# Patient Record
Sex: Female | Born: 1970 | Race: White | Hispanic: No | Marital: Married | Smoking: Former smoker
Health system: Southern US, Community
[De-identification: ages and names within clinical notes are randomized; demographics above are authoritative.]

## PROBLEM LIST (undated history)

## (undated) ENCOUNTER — Emergency Department: Payer: Self-pay

## (undated) DIAGNOSIS — I10 Essential (primary) hypertension: Secondary | ICD-10-CM

## (undated) HISTORY — PX: CHOLECYSTECTOMY: SHX55

---

## 2006-11-18 ENCOUNTER — Encounter: Admission: RE | Admit: 2006-11-18 | Discharge: 2006-11-18 | Payer: Self-pay | Admitting: Gastroenterology

## 2006-12-21 ENCOUNTER — Ambulatory Visit (HOSPITAL_COMMUNITY): Admission: RE | Admit: 2006-12-21 | Discharge: 2006-12-21 | Payer: Self-pay | Admitting: General Surgery

## 2006-12-21 ENCOUNTER — Encounter (INDEPENDENT_AMBULATORY_CARE_PROVIDER_SITE_OTHER): Payer: Self-pay | Admitting: General Surgery

## 2008-08-16 ENCOUNTER — Ambulatory Visit: Payer: Self-pay | Admitting: Family Medicine

## 2008-08-16 DIAGNOSIS — J029 Acute pharyngitis, unspecified: Secondary | ICD-10-CM | POA: Insufficient documentation

## 2008-08-17 ENCOUNTER — Encounter: Payer: Self-pay | Admitting: Family Medicine

## 2009-01-28 IMAGING — CT CT ABDOMEN W/ CM
2 of 5 series · 17 of 46 positions shown, 19 images · IV contrast (READICAT/WATER & [ID] OMNI 300)
Comparison: None.

ABDOMEN CT WITH CONTRAST:

CLINICAL DATA: Abdominal pain in the right midabdomen
TECHNIQUE: Multidetector CT imaging of the abdomen and pelvis was performed
following the standard protocol during bolus administration of intravenous
contrast.

Contrast:  100 cc Omnipaque 300

[Series 3: routine abdomen · axial · 0.73mm/px · z∈[-388,-18]mm · 14 of 75 slices shown, 16 images]
[im 5/75  soft-tissue]
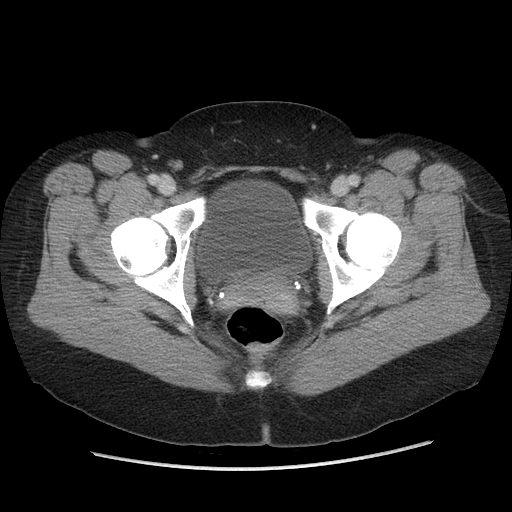
[im 5/75  bone]
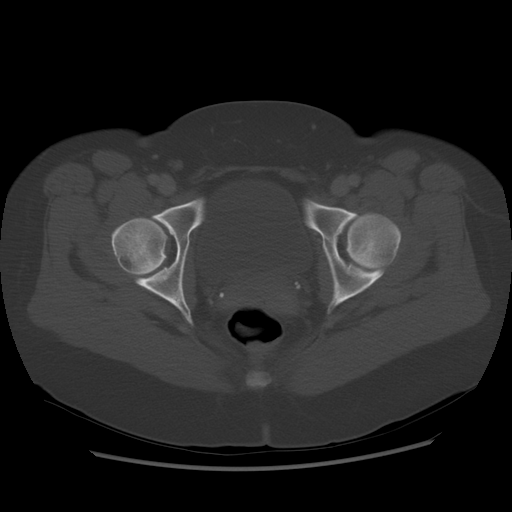
[im 9/75  soft-tissue]
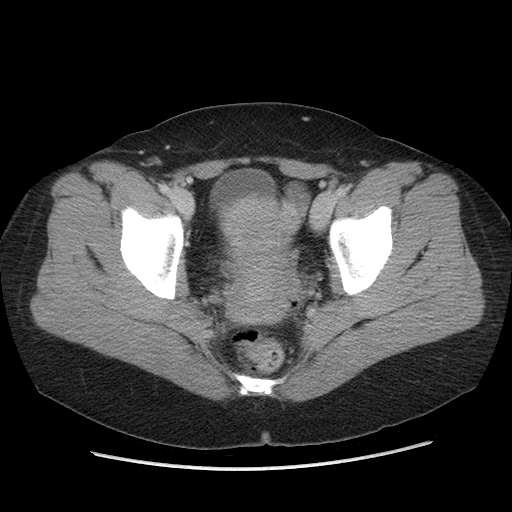
[im 14/75  soft-tissue]
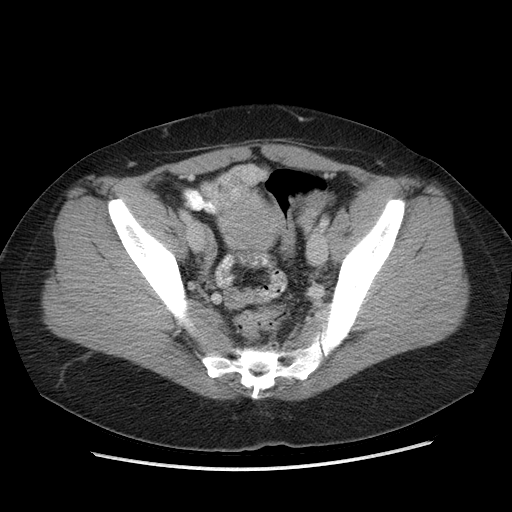
[im 22/75  soft-tissue]
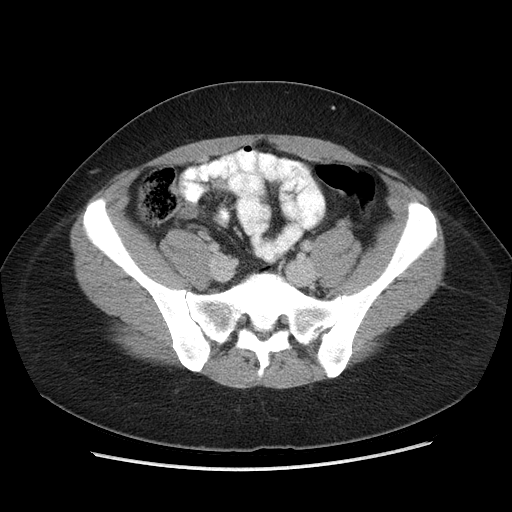
[im 27/75  soft-tissue]
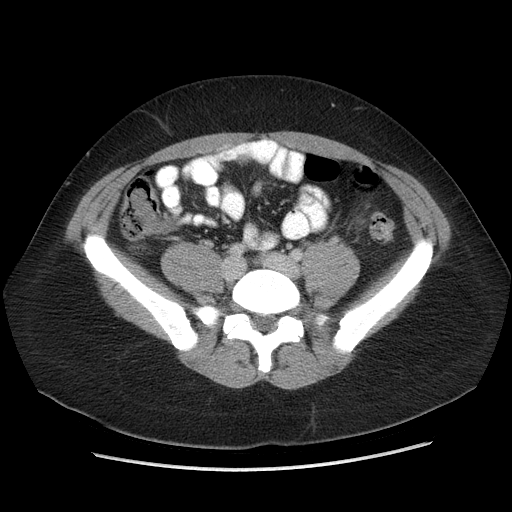
[im 31/75  soft-tissue]
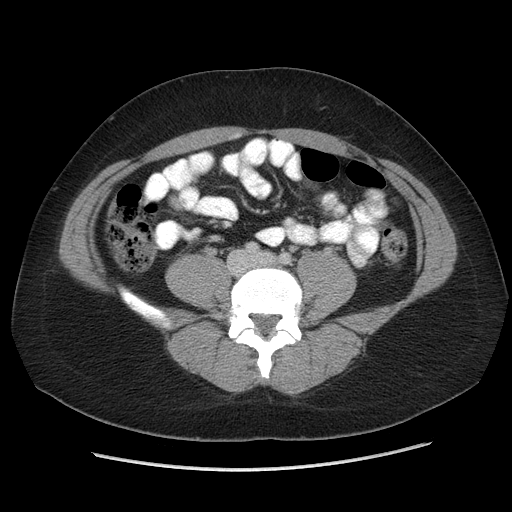
[im 35/75  soft-tissue]
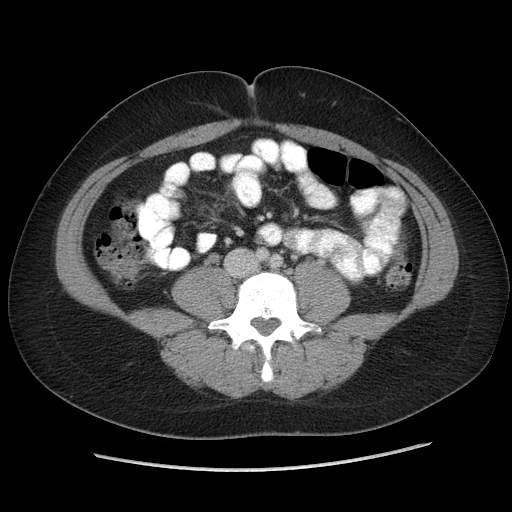
[im 40/75  soft-tissue]
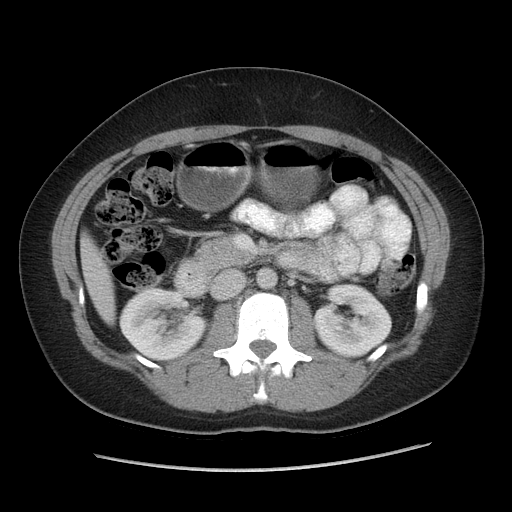
[im 44/75  soft-tissue]
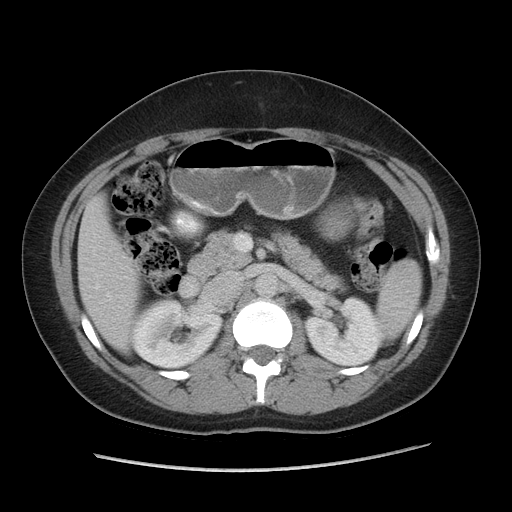
[im 44/75  bone]
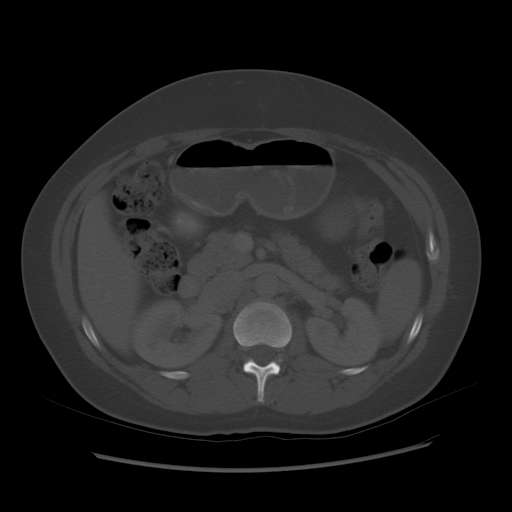
[im 48/75  soft-tissue]
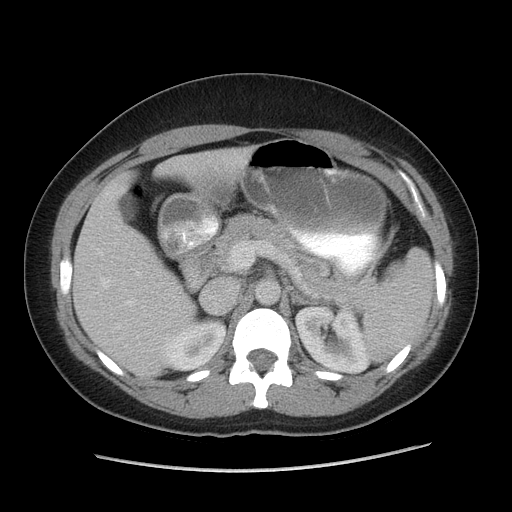
[im 57/75  soft-tissue]
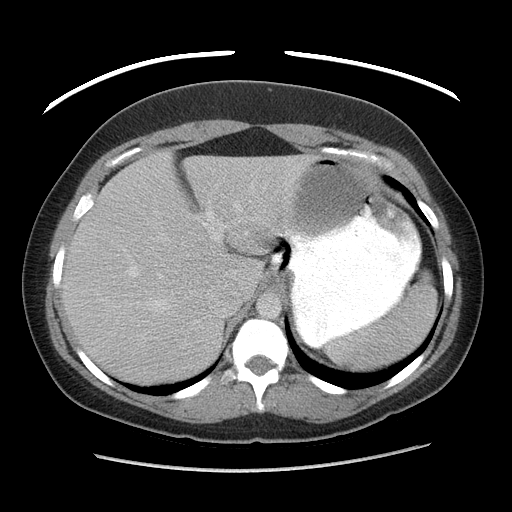
[im 61/75  soft-tissue]
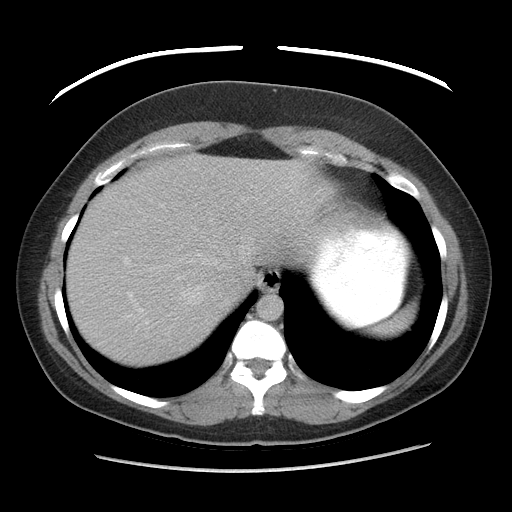
[im 66/75  soft-tissue]
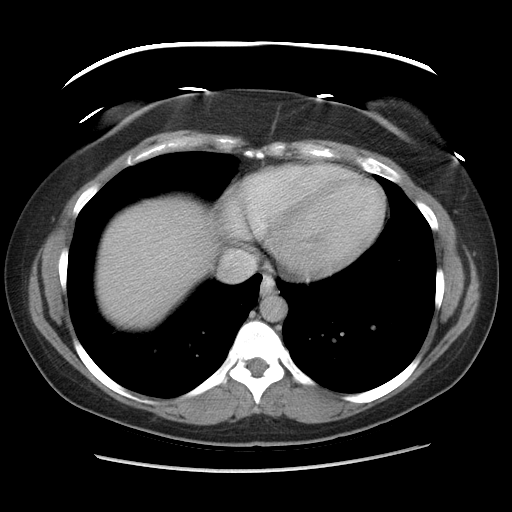
[im 70/75  soft-tissue]
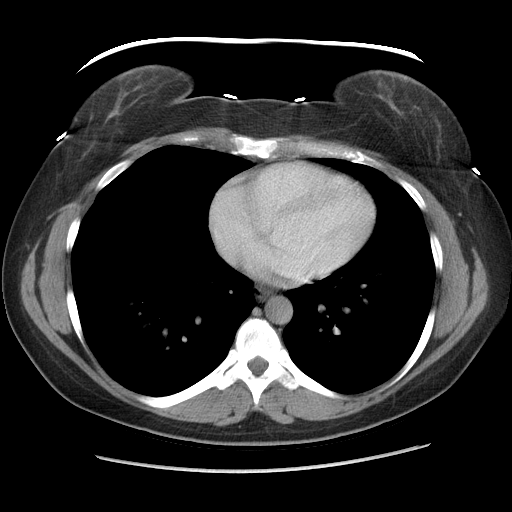

[Series 602: sagittal body · sagittal · 0.91mm/px · 3 of 151 slices shown]
[im 51/151  soft-tissue]
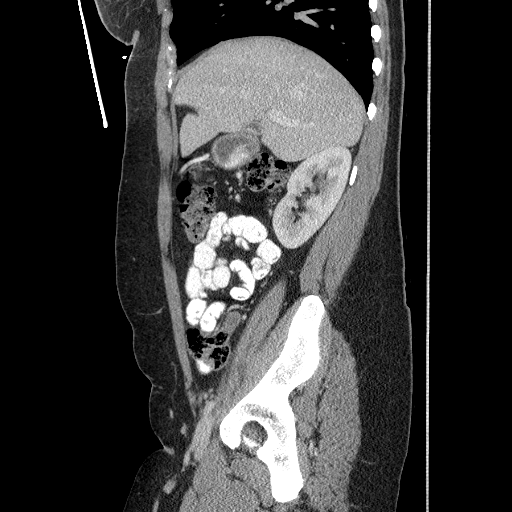
[im 67/151  soft-tissue]
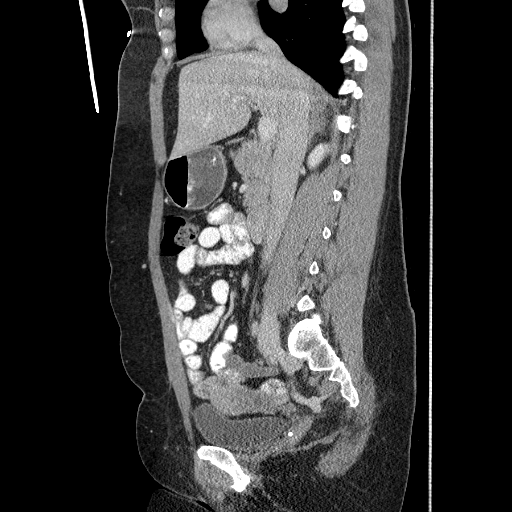
[im 84/151  soft-tissue]
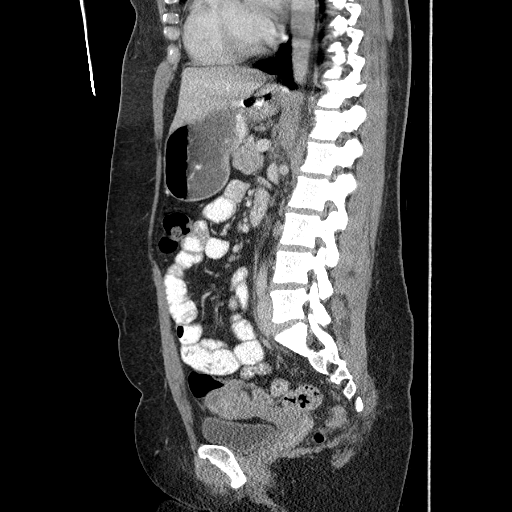

[17 of 46 positions shown; findings below may reference images not displayed]

FINDINGS: No focal abnormality in the liver or spleen. The stomach, duodenum,
pancreas, adrenal glands, and kidneys have normal imaging features. 1.8 cm
calcified gallstone is seen in the lumen of the gallbladder. There is no intra
or extrahepatic biliary duct dilatation.

No abdominal lymphadenopathy. There is no intraperitoneal free fluid. Abdominal
bowel loops have normal imaging features.
IMPRESSION: Cholelithiasis without evidence for complicating features.

PELVIS CT WITH CONTRAST:
FINDINGS: No intraperitoneal free fluid. No pelvic lymphadenopathy. 2.6 cm left
ovarian cyst is noted. The right ovary and uterus are normal. The terminal ileum
and the appendix are normal. No appreciable diverticular change in the colon and
there is no evidence for diverticulitis.

Mild sclerotic changes seen at the right sacroiliac joint. The bones are
otherwise unremarkable.
IMPRESSION: 2.6 cm left ovarian cyst. Followup ultrasound in 6 weeks is recommended to
ensure that this resolves.

Otherwise normal exam.

## 2010-06-09 NOTE — Op Note (Signed)
Haley Ross, Haley Ross             ACCOUNT NO.:  [id number]   MEDICAL RECORD NO.:  [id number]          PATIENT TYPE:  AMB   LOCATION:  SDS                          FACILITY:  [facility]   PHYSICIAN:  Cherylynn Ridges, M.D.    DATE OF BIRTH:  Oct 22, 1970   DATE OF PROCEDURE:  12/21/2006  DATE OF DISCHARGE:                               OPERATIVE REPORT   PREOPERATIVE DIAGNOSIS:  Symptomatic cholelithiasis.   POSTOPERATIVE DIAGNOSIS:  Symptomatic cholelithiasis.   PROCEDURE:  Laparoscopic cholecystectomy.   SURGEON:  Marta Lamas. Lindie Spruce, M.D.   ASSISTANT:  Angelia Mould. Derrell Lolling, M.D.   ANESTHESIA:  General endotracheal.   ESTIMATED BLOOD LOSS:  Less than 20 mL.   COMPLICATIONS:  None.   CONDITION:  Stable.   FINDINGS:  No evidence of acute gallbladder disease.  The patient had a  very small less than 2-mm cystic duct lumen, which could not be  cannulated for cholangiogram.   INDICATIONS FOR OPERATION:  The patient is a 40 year old with recurrent  attacks of abdominal pain in the right upper quadrant who comes in now  for a laparoscopic cholecystectomy with known gallstones.   OPERATION:  The patient was taken to the operating room, placed on the  table in supine position.  After an adequate general endotracheal  anesthetic was administered, she was prepped and draped in the usual  sterile manner exposing the midline and the right upper quadrant of the  abdomen.   An infraumbilical linear incision was made longitudinally just below the  umbilicus and taken down to the midline fascia.  We then incised the  fascia at that level using a 15 blade and grabbed the edges with Kocher  clamps.  We bluntly dissected down to the peritoneal cavity, through the  posterior aspect of the rectus sheath into the peritoneal cavity.  Once  this was done, a pursestring suture of 0 Vicryl was passed around the  fascial opening and a Hasson cannula passed into the peritoneal cavity.   Once the [facility] cannula  was in position and secured in place with the  pursestring suture, a carbon dioxide insufflation was instilled into the  peritoneal cavity up to a maximal intra-abdominal pressure of 15 mmHg.  Once this was done, two right costal margin 5-mm cannulas and a  subxiphoid 11/12-mm cannula were passed under direct vision.  The  patient was placed in reverse Trendelenburg.  The left side was tilted  down.   The dome of the gallbladder was retracted toward the anterior abdominal  wall and the right upper quadrant.  A second one was passed onto the  infundibulum, and we dissected out the peritoneum overlying the triangle  of Calot and hepatoduodenal triangle.  This exposed the cystic duct and  the cystic artery.  We clipped the gallbladder side of the cystic duct  and made a cholecystodochotomy, at which time we found that there was a  very small lumen.  Attempts to cannulate it with a Cook catheter passed  through the anterior abdominal wall were unsuccessful and a decision was  made because of normal liver function tests to  bypass a cholangiogram.   We clipped the distal cystic duct x2, transected the cystic duct,  dissected out the cystic artery, clipped it proximally and distally,  then dissected out the gallbladder from its bed with minimal difficulty,  using an EndoCatch bag to remove it from this infraumbilical site,  closing off the site once the gallbladder was retrieved using the  pursestring suture which was in place.   Once this was done we irrigated the right upper quadrant and the  subhepatic space with saline, approximately 2 L were used.  We obtained  hemostasis in the bed using electrocautery.  Once this completed, we  removed all cannulas while aspirating all fluid and gas from above the  liver.   The supraumbilical and subxiphoid skin sites were closed a using running  subcuticular stitch of 4-0 Vicryl.  All sites were injected with 0.25%  Marcaine with epinephrine.   Dermabond was applied to the infraumbilical  incision site.  The others were closed with Dermabond and Steri-Strips  and also Tegaderm.  All counts were correct.      Cherylynn Ridges, M.D.  Electronically Signed     JOW/MEDQ  D:  12/21/2006  T:  12/21/2006  Job:  [id number]   cc:   Trish Mage, M.D.

## 2010-11-03 LAB — DIFFERENTIAL
Basophils Absolute: 0
Eosinophils Relative: 4
Lymphs Abs: 2

## 2010-11-03 LAB — COMPREHENSIVE METABOLIC PANEL
Alkaline Phosphatase: 32 — ABNORMAL LOW
BUN: 13
CO2: 27
Calcium: 9
Chloride: 101
Creatinine, Ser: 0.81
GFR calc Af Amer: >60
Glucose, Bld: 79
Potassium: 4.2
Sodium: 133 — ABNORMAL LOW

## 2010-11-03 LAB — CBC
Hemoglobin: 13.6
MCV: 94.7
Platelets: 261
RBC: 4.2

## 2011-06-25 ENCOUNTER — Emergency Department
Admission: EM | Admit: 2011-06-25 | Discharge: 2011-06-25 | Disposition: A | Discharge status: Home / Self Care | Payer: 59 | Source: Home / Self Care | Attending: Emergency Medicine | Admitting: Emergency Medicine

## 2011-06-25 ENCOUNTER — Encounter: Payer: Self-pay | Admitting: Emergency Medicine

## 2011-06-25 DIAGNOSIS — R599 Enlarged lymph nodes, unspecified: Secondary | ICD-10-CM

## 2011-06-25 MED ORDER — DOXYCYCLINE HYCLATE 100 MG PO CAPS: 100.0000 mg | ORAL_CAPSULE | Freq: Two times a day (BID) | ORAL | Status: AC

## 2011-06-25 NOTE — ED Provider Notes (Signed)
History     CSN: [id number]  Arrival date & time 06/25/11  1911   First MD Initiated Contact with Patient 06/25/11 1931      Chief Complaint  Patient presents with  . 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  She presents today with left ear pain.  She noticed a bump on the back side of her left ear a few days ago.  It is painful and now seems to radiate to the lower part of her ear.  She was sick last week with a sore throat and some sinus pressure but that went away.  She is a smoker.  She has not been using any medications or modalities.  It hurts to press on it but feels better when she lays on her own.  She has not been swimming recently, there is no ear drainage.  History reviewed. No pertinent past medical history.  Past Surgical History  Procedure Date  . Cholecystectomy   . Cesarean section     No family history on file.  History  Substance Use Topics  . Smoking status: Current Everyday Smoker -- 1.0 packs/day for 20 years  . Smokeless tobacco: Not on file  . Alcohol Use: Yes    OB History    Grav Para Term Preterm Abortions TAB SAB Ect Mult Living                  Review of Systems  All other systems reviewed and are negative.    Allergies  Codeine  Home Medications   Current Outpatient Rx  Name Route Sig Dispense Refill  . CETIRIZINE HCL 10 MG PO TABS Oral Take 10 mg by mouth daily.    Marland Kitchen FLUTICASONE PROPIONATE 50 MCG/ACT NA SUSP Nasal Place 2 sprays into the nose daily.    Marland Kitchen DOXYCYCLINE HYCLATE 100 MG PO CAPS Oral Take 1 capsule (100 mg total) by mouth 2 (two) times daily. 14 capsule 0    BP 152/91  Pulse 70  Temp(Src) 99.1 F (37.3 C) (Oral)  Resp 12  Ht 5\' 4"  (1.626 m)  Wt 164 lb (74.39 kg)  BMI 28.15 kg/m2  SpO2 100%  LMP 06/11/2011  Physical Exam  Nursing note and vitals reviewed. Constitutional: She is  oriented to person, place, and time. She appears well-developed and well-nourished.  HENT:  Head: Normocephalic and atraumatic.  Ears:  Nose: Nose normal.  Mouth/Throat: Uvula is midline and oropharynx is clear and moist.       Behind her left ear in the posterior auricular lymph node chain there is a large 1 cm tender lymph node.  There are smaller lymph nodes inferior to this it area as well.  There is no drainage or sign of abscess.  Ear examination is normal with no signs of otitis media or otitis externa.  Eyes: No scleral icterus.  Neck: Neck supple.  Cardiovascular: Regular rhythm and normal heart sounds.   Pulmonary/Chest: Effort normal and breath sounds normal. No respiratory distress.  Neurological: She is alert and oriented to person, place, and time.  Skin: Skin is warm and dry.  Psychiatric: She has a normal mood and affect. Her speech is normal.    ED Course  Procedures (including critical care time)  Labs Reviewed - No data to display No results found.   1. Lymph nodes enlarged       MDM   This patient apparently has  a reactive lymph node on the posterior auricular chain. I feel is most likely secondary to her illness she had last week.  I've advised her to use ibuprofen for the next few days as well as an ice pack.  If she is not getting better or she is getting worse including fevers or ear pain, I gave her prescription for doxycycline that she can start.  If further problems past there.  She will followup with her PCP.  Marland Kitchen    Marlaine Hind, MD 06/25/11 [phone]

## 2011-06-25 NOTE — ED Notes (Signed)
Left outer ear pain, knot behind ear x 3 daysw

## 2012-02-09 ENCOUNTER — Other Ambulatory Visit: Payer: 59

## 2012-02-09 ENCOUNTER — Other Ambulatory Visit: Payer: Self-pay | Admitting: Orthopaedic Surgery

## 2012-02-09 ENCOUNTER — Ambulatory Visit
Admission: RE | Admit: 2012-02-09 | Discharge: 2012-02-09 | Disposition: A | Discharge status: Home / Self Care | Payer: 59 | Source: Ambulatory Visit | Attending: Orthopaedic Surgery | Admitting: Orthopaedic Surgery

## 2012-02-09 DIAGNOSIS — R609 Edema, unspecified: Secondary | ICD-10-CM

## 2014-04-21 IMAGING — US US EXTREM LOW VENOUS*R*
1 series · 13 of 24 positions shown · non-contrast
Comparison: None.

CLINICAL DATA: Right leg pain, swelling

RIGHT LOWER EXTREMITY VENOUS DUPLEX ULTRASOUND
TECHNIQUE: Gray-scale sonography with graded compression, as well
as color Doppler and duplex ultrasound were performed to evaluate
the deep venous system of the lower extremity from the level of the
common femoral vein through the popliteal and proximal calf veins.
Spectral Doppler was utilized to evaluate flow at rest and with
distal augmentation maneuvers.

[Series 1: us extrem low venous*right* · 13 of 37 slices shown]
[im 1/37]
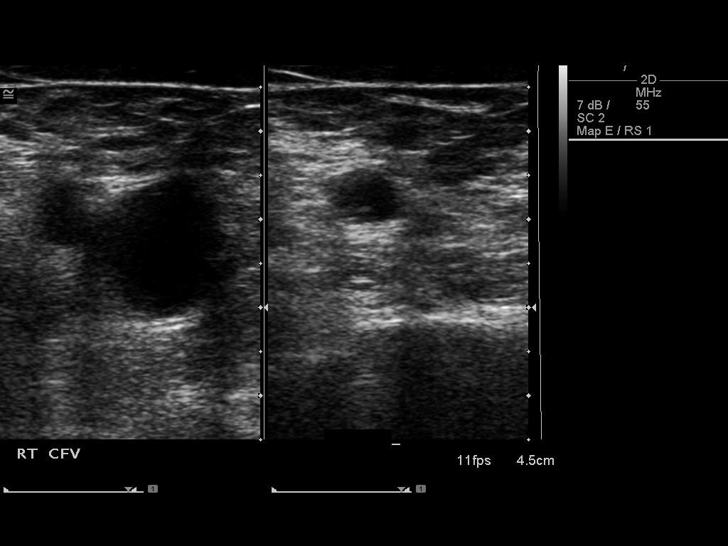
[im 4/37]
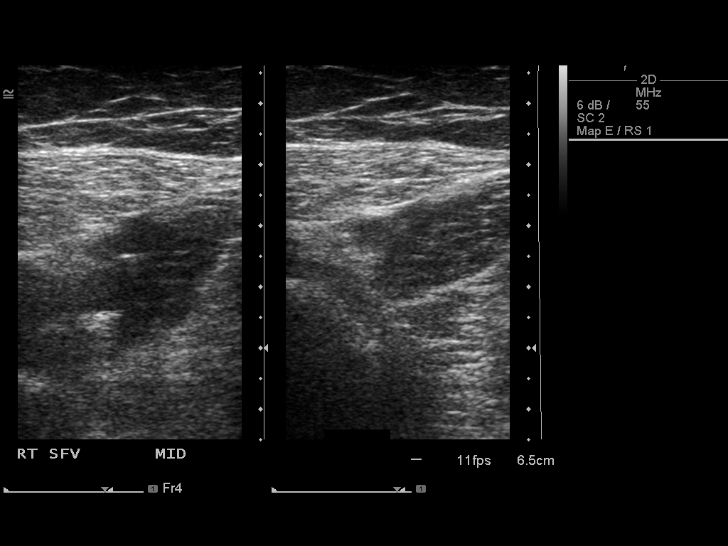
[im 7/37]
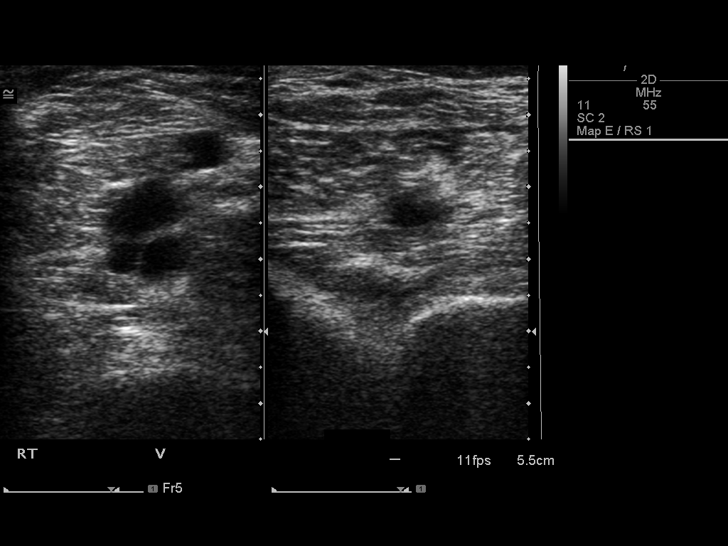
[im 10/37]
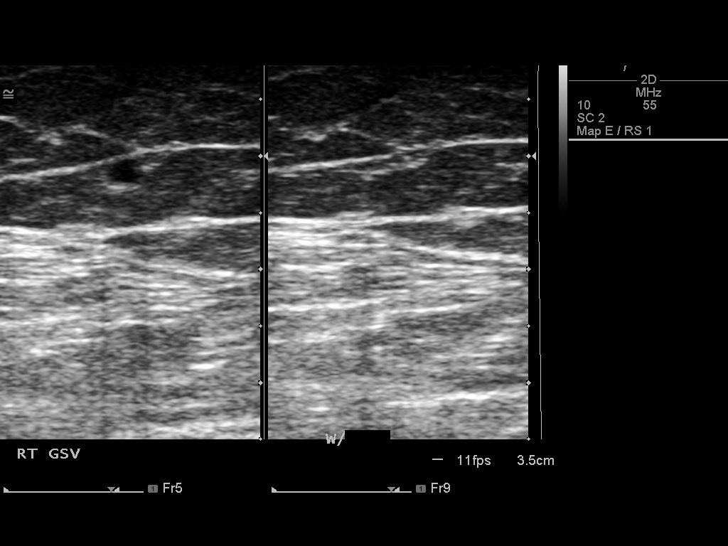
[im 13/37]
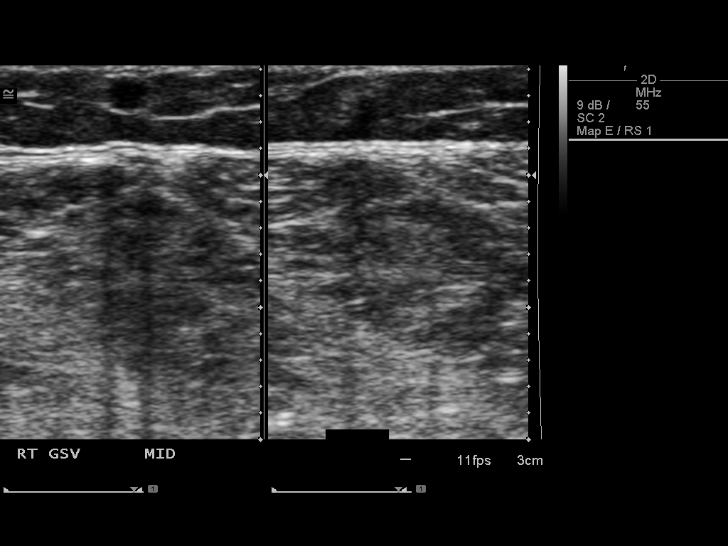
[im 16/37]
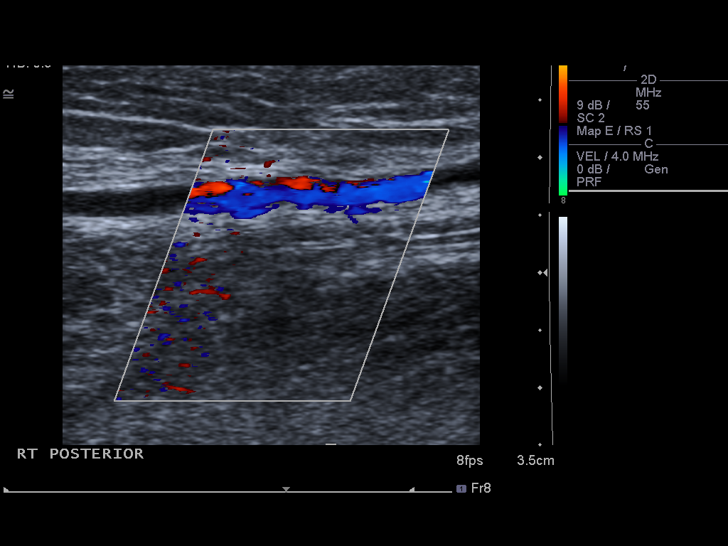
[im 19/37]
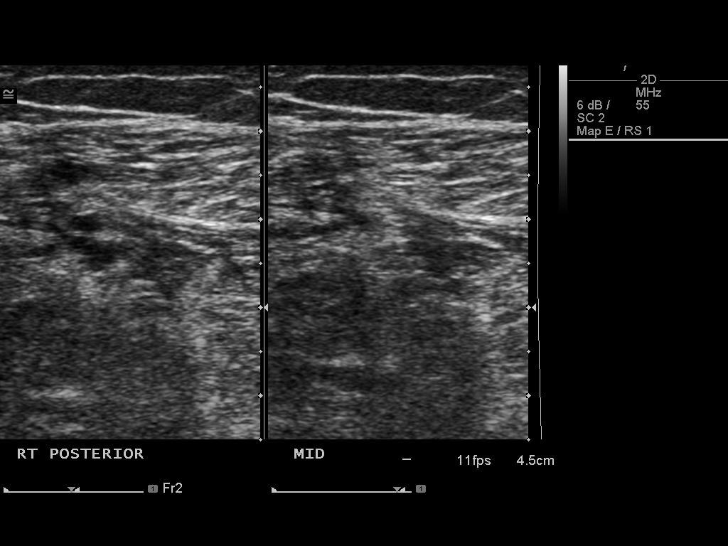
[im 21/37]
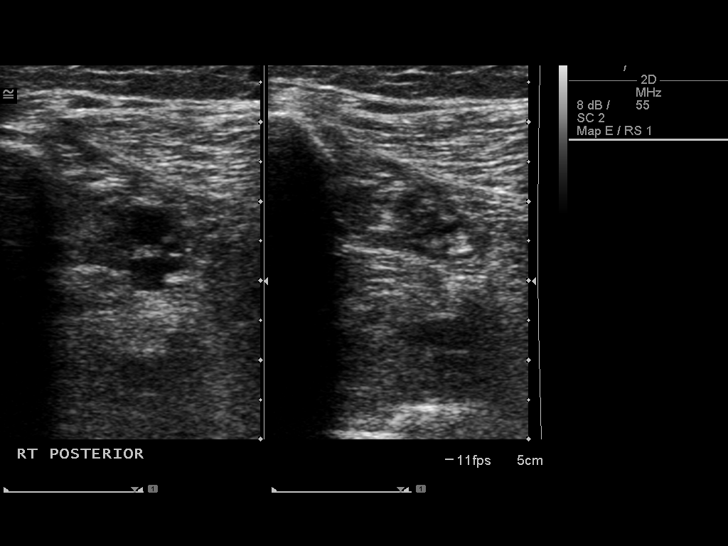
[im 24/37]
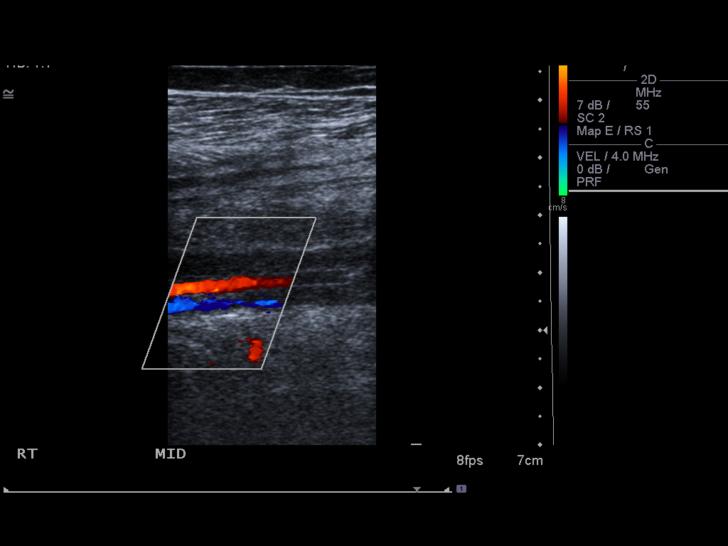
[im 27/37]
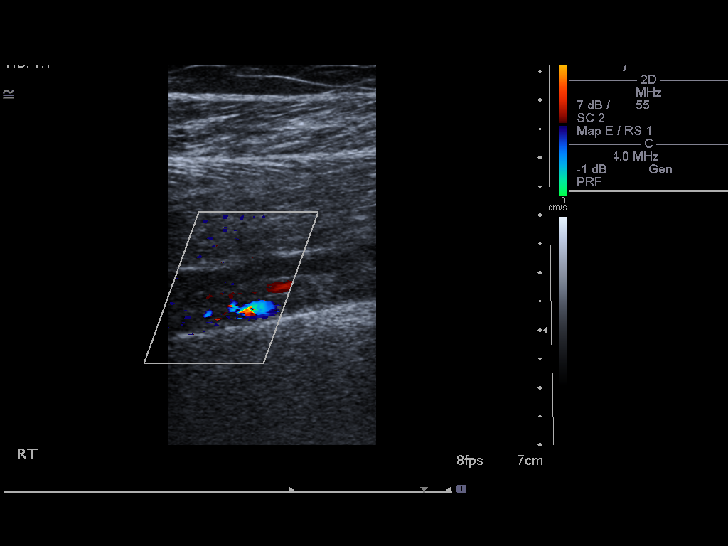
[im 30/37]
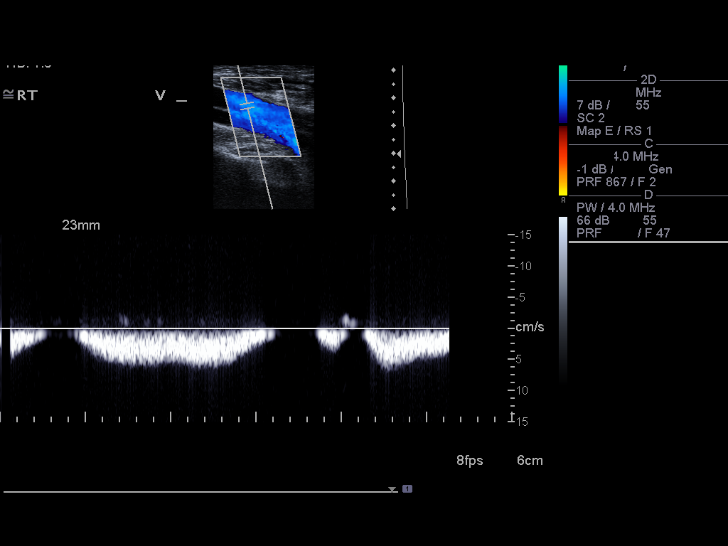
[im 33/37]
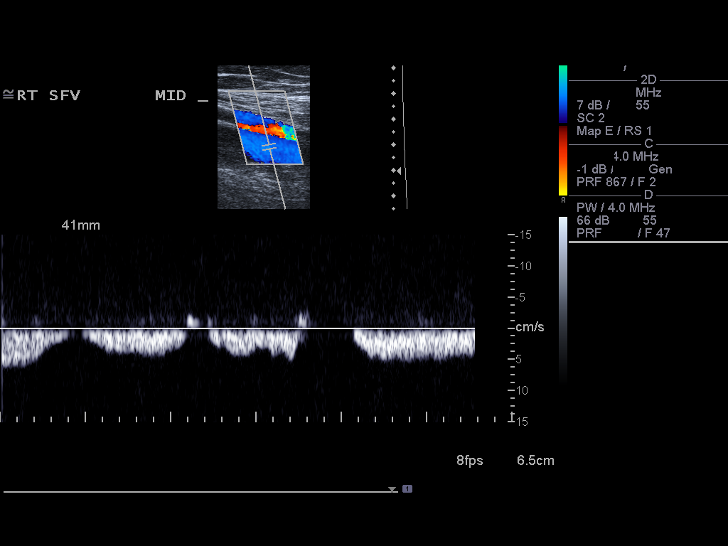
[im 37/37]
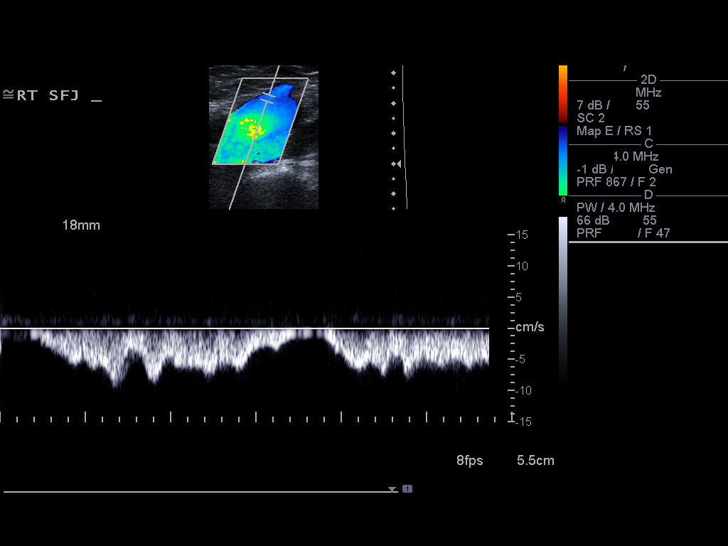

[13 of 24 positions shown; findings below may reference images not displayed]

FINDINGS: Normal compressibility of the common femoral,
superficial femoral, and popliteal veins is demonstrated.  No
filling defects to suggest DVT on grayscale or color Doppler
imaging.  Doppler waveforms show normal direction of venous flow,
normal respiratory phasicity and response to augmentation.

In the mid calf, there is absence of filling within one of the
paired peroneal veins.  The venous lumen is filled with a
hypoechoic, homogeneous material.  Augmentation was not performed
on this venous segment.  This segment of vessel is noncompressible.
Overall, findings are consistent with acute thrombus in one of the
paired and peroneal veins in the mid calf.  The great saphenous
vein is patent and compressible.
IMPRESSION: Positive for deep venous thrombosis of one of the paired peroneal
veins in the mid calf. The more proximal deep venous system is
patent.

These results will be called to the ordering clinician or
representative by the Radiologist Assistant, and communication
documented in the PACS Dashboard.

## 2019-01-26 DIAGNOSIS — F329 Major depressive disorder, single episode, unspecified: Secondary | ICD-10-CM

## 2019-01-26 DIAGNOSIS — F419 Anxiety disorder, unspecified: Secondary | ICD-10-CM

## 2019-01-26 DIAGNOSIS — F32A Depression, unspecified: Secondary | ICD-10-CM

## 2019-01-26 HISTORY — DX: Anxiety disorder, unspecified: F41.9

## 2019-01-26 HISTORY — DX: Depression, unspecified: F32.A

## 2019-01-26 HISTORY — DX: Major depressive disorder, single episode, unspecified: F32.9

## 2019-04-14 ENCOUNTER — Other Ambulatory Visit: Payer: Self-pay

## 2019-04-14 ENCOUNTER — Encounter: Payer: Self-pay | Admitting: Emergency Medicine

## 2019-04-14 ENCOUNTER — Emergency Department
Admission: EM | Admit: 2019-04-14 | Discharge: 2019-04-14 | Disposition: A | Discharge status: Home / Self Care | Payer: Self-pay | Source: Home / Self Care

## 2019-04-14 DIAGNOSIS — J020 Streptococcal pharyngitis: Secondary | ICD-10-CM

## 2019-04-14 HISTORY — DX: Essential (primary) hypertension: I10

## 2019-04-14 LAB — POCT RAPID STREP A (OFFICE): Rapid Strep A Screen: NEGATIVE

## 2019-04-14 NOTE — ED Triage Notes (Signed)
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

## 2019-04-14 NOTE — ED Provider Notes (Signed)
[facility]    CSN: [id number] Arrival date & time: 04/14/19  1356      History   Chief Complaint Chief Complaint  Patient presents with  . Sore Throat    right    HPI Haley Ross is a 49 y.o. female.   HPI Haley Ross is a 49 y.o. 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  Pt received her full course of Covid-19 vaccine several weeks ago.  Today, pain is worse on the right side of her throat, hurts to swallow, 4/10.  Denies fever, chills, n/v/d. No cough or congestion. No medication taken PTA.  No known sick contacts or recent travel.   Past Medical History:  Diagnosis Date  . Anxiety 2021  . Depression 2021  . Hypertension     Patient Active Problem List   Diagnosis Date Noted  . ACUTE PHARYNGITIS 08/16/2008    Past Surgical History:  Procedure Laterality Date  . CESAREAN SECTION    . CHOLECYSTECTOMY      OB History   No obstetric history on file.      Home Medications    Prior to Admission medications   Medication Sig Start Date End Date Taking? Authorizing Provider  citalopram (CELEXA) 20 MG tablet Take 20 mg by mouth daily.   Yes [provider]  lisinopril (ZESTRIL) 20 MG tablet Take 20 mg by mouth daily. 03/27/19  Yes [provider]  cetirizine (ZYRTEC) 10 MG tablet Take 10 mg by mouth daily.    [provider]  fluticasone (FLONASE) 50 MCG/ACT nasal spray Place 2 sprays into the nose daily.    [provider]    Family History Family History  Problem Relation Age of Onset  . Hypertension Mother   . Hypertension Father   . Healthy Sister   . Healthy Sister     Social History Social History   Tobacco Use  . Smoking status: Current Every Day Smoker    Packs/day: 1.00    Years: 25.00    Pack years: 25.00  . Smokeless tobacco: Never Used  Substance Use Topics  . Alcohol use: Never  . Drug use:  Never     Allergies   Codeine   Review of Systems Review of Systems  Constitutional: Negative for chills and fever.  HENT: Positive for sore throat. Negative for congestion, ear pain, trouble swallowing and voice change.   Respiratory: Negative for cough and shortness of breath.   Cardiovascular: Negative for chest pain and palpitations.  Gastrointestinal: Negative for abdominal pain, diarrhea, nausea and vomiting.  Musculoskeletal: Negative for arthralgias, back pain and myalgias.  Skin: Negative for rash.  Neurological: Negative for dizziness, light-headedness and headaches.  All other systems reviewed and are negative.    Physical Exam Triage Vital Signs ED Triage Vitals  Enc Vitals Group     BP 04/14/19 1425 114/72     Pulse Rate 04/14/19 1425 66     Resp 04/14/19 1425 14     Temp 04/14/19 1425 99.1 F (37.3 C)     Temp Source 04/14/19 1425 Oral     SpO2 04/14/19 1425 98 %     Weight 04/14/19 1426 180 lb (81.6 kg)     Height 04/14/19 1426 5\' 4"  (1.626 m)     Head Circumference --      Peak Flow --      Pain  Score 04/14/19 1425 4     Pain Loc --      Pain Edu? --      Excl. in GC? --    No data found.  Updated Vital Signs BP 114/72 (BP Location: Right Arm)   Pulse 66   Temp 99.1 F (37.3 C) (Oral)   Resp 14   Ht 5\' 4"  (1.626 m)   Wt 180 lb (81.6 kg)   LMP 01/26/2019 (Approximate)   SpO2 98%   BMI 30.90 kg/m   Visual Acuity Right Eye Distance:   Left Eye Distance:   Bilateral Distance:    Right Eye Near:   Left Eye Near:    Bilateral Near:     Physical Exam Vitals and nursing note reviewed.  Constitutional:      General: She is not in acute distress.    Appearance: She is well-developed. She is not ill-appearing, toxic-appearing or diaphoretic.  HENT:     Head: Normocephalic and atraumatic.     Right Ear: Tympanic membrane and ear canal normal.     Left Ear: Tympanic membrane and ear canal normal.     Nose: Nose normal.     Right Sinus: No  maxillary sinus tenderness or frontal sinus tenderness.     Left Sinus: No maxillary sinus tenderness or frontal sinus tenderness.     Mouth/Throat:     Lips: Pink.     Mouth: Mucous membranes are moist.     Pharynx: Oropharynx is clear. Uvula midline. Posterior oropharyngeal erythema present. No pharyngeal swelling, oropharyngeal exudate or uvula swelling.     Tonsils: No tonsillar exudate or tonsillar abscesses.  Cardiovascular:     Rate and Rhythm: Normal rate and regular rhythm.  Pulmonary:     Effort: Pulmonary effort is normal. No respiratory distress.     Breath sounds: Normal breath sounds. No stridor. No wheezing, rhonchi or rales.  Musculoskeletal:        General: Normal range of motion.     Cervical back: Normal range of motion and neck supple.  Lymphadenopathy:     Cervical: Cervical adenopathy present.  Skin:    General: Skin is warm and dry.  Neurological:     Mental Status: She is alert and oriented to person, place, and time.  Psychiatric:        Behavior: Behavior normal.      UC Treatments / Results  Labs (all labs ordered are listed, but only abnormal results are displayed) Labs Reviewed  STREP A DNA PROBE  POCT RAPID STREP A (OFFICE)    EKG   Radiology No results found.  Procedures Procedures (including critical care time)  Medications Ordered in UC Medications - No data to display  Initial Impression / Assessment and Plan / UC Course  I have reviewed the triage vital signs and the nursing notes.  Pertinent labs & imaging results that were available during my care of the patient were reviewed by me and considered in my medical decision making (see chart for details).     Rapid strep: NEGATIVE Culture sent Encouraged symptomatic tx  AVS provided  Final Clinical Impressions(s) / UC Diagnoses   Final diagnoses:  Acute pharyngitis     Discharge Instructions      You may take 500mg  acetaminophen every 4-6 hours or in combination with  ibuprofen 400-600mg  every 6-8 hours as needed for pain, inflammation, and fever.  Be sure to well hydrated with clear liquids and get at least 8 hours of  sleep at night, preferably more while sick.   Please follow up with family medicine in 1 week if needed.     ED Prescriptions    None     PDMP not reviewed this encounter.   Noe Gens, [place] 04/15/19 [phone]

## 2019-04-14 NOTE — Discharge Instructions (Signed)
  You may take 500mg acetaminophen every 4-6 hours or in combination with ibuprofen 400-600mg every 6-8 hours as needed for pain, inflammation, and fever.  Be sure to well hydrated with clear liquids and get at least 8 hours of sleep at night, preferably more while sick.   Please follow up with family medicine in 1 week if needed.   

## 2019-04-15 LAB — STREP A DNA PROBE: Group A Strep Probe: NOT DETECTED

## 2021-02-02 ENCOUNTER — Other Ambulatory Visit: Payer: Self-pay

## 2021-02-02 ENCOUNTER — Emergency Department: Admission: EM | Admit: 2021-02-02 | Payer: 59 | Source: Home / Self Care

## 2023-08-18 ENCOUNTER — Ambulatory Visit
Admission: EM | Admit: 2023-08-18 | Discharge: 2023-08-18 | Disposition: A | Discharge status: Home / Self Care | Attending: Family Medicine | Admitting: Family Medicine

## 2023-08-18 ENCOUNTER — Other Ambulatory Visit: Payer: Self-pay

## 2023-08-18 DIAGNOSIS — J069 Acute upper respiratory infection, unspecified: Secondary | ICD-10-CM

## 2023-08-18 DIAGNOSIS — J209 Acute bronchitis, unspecified: Secondary | ICD-10-CM | POA: Diagnosis not present

## 2023-08-18 LAB — POCT INFLUENZA A/B
Influenza A, POC: NEGATIVE
Influenza B, POC: NEGATIVE

## 2023-08-18 LAB — POC SARS CORONAVIRUS 2 AG -  ED: SARS Coronavirus 2 Ag: NEGATIVE

## 2023-08-18 MED ORDER — BENZONATATE 200 MG PO CAPS: 200.0000 mg | ORAL_CAPSULE | Freq: Three times a day (TID) | ORAL | 0 refills | Status: AC | PRN

## 2023-08-18 MED ORDER — AZITHROMYCIN 250 MG PO TABS: ORAL_TABLET | ORAL | 0 refills | Status: AC

## 2023-08-18 MED ORDER — FLUCONAZOLE 150 MG PO TABS: 150.0000 mg | ORAL_TABLET | Freq: Every day | ORAL | 0 refills | Status: AC

## 2023-08-18 NOTE — Discharge Instructions (Addendum)
 Drink lots of fluids Take the azithromycin  antibiotic as directed Take the Tessalon  2-3 times a day to help reduce cough May take Mucinex DM in addition if needed See your doctor if not improving by next week COVID and flu testing are negative

## 2023-08-18 NOTE — ED Provider Notes (Signed)
 [facility]    CSN: [id number] Arrival date & time: 08/18/23  1117      History   Chief Complaint Chief Complaint  Patient presents with   Cough    HPI Haley Ross is a 53 y.o. female.   Patient had a long travel from [place] to home.  She started feeling sick while on the flight.  She has cough and congestion, green mucus, sinus drainage, sweats and chills.  Does not know if she has had a fever.  No underlying lung disease.  Non-smoker.  Feels somewhat short of breath.  Chest hurts from coughing.    Past Medical History:  Diagnosis Date   Anxiety 2021   Depression 2021   Hypertension     Patient Active Problem List   Diagnosis Date Noted   ACUTE PHARYNGITIS 08/16/2008    Past Surgical History:  Procedure Laterality Date   CESAREAN SECTION     CHOLECYSTECTOMY      OB History   No obstetric history on file.      Home Medications    Prior to Admission medications   Medication Sig Start Date End Date Taking? Authorizing Provider  azithromycin  (ZITHROMAX  Z-PAK) 250 MG tablet Take two pills today followed by one a day until gone 08/18/23  Yes Maranda Jamee Jacob, MD  benzonatate  (TESSALON ) 200 MG capsule Take 1 capsule (200 mg total) by mouth 3 (three) times daily as needed for cough. 08/18/23  Yes Maranda Jamee Jacob, MD  citalopram (CELEXA) 20 MG tablet Take 20 mg by mouth daily.    [provider]  lisinopril (ZESTRIL) 20 MG tablet Take 20 mg by mouth daily. 03/27/19   [provider]    Family History Family History  Problem Relation Age of Onset   Hypertension Mother    Hypertension Father    Healthy Sister    Healthy Sister     Social History Social History   Tobacco Use   Smoking status: Former    Types: Cigarettes   Smokeless tobacco: Never  Vaping Use   Vaping status: Every Day  Substance Use Topics   Alcohol use: Yes   Drug use: Never     Allergies   Codeine   Review of Systems Review of  Systems  See HPI Physical Exam Triage Vital Signs ED Triage Vitals  Encounter Vitals Group     BP 08/18/23 1129 (!) 140/85     Girls Systolic BP Percentile --      Girls Diastolic BP Percentile --      Boys Systolic BP Percentile --      Boys Diastolic BP Percentile --      Pulse Rate 08/18/23 1129 99     Resp 08/18/23 1129 16     Temp 08/18/23 1129 99.4 F (37.4 C)     Temp src --      SpO2 08/18/23 1129 97 %     Weight --      Height --      Head Circumference --      Peak Flow --      Pain Score 08/18/23 1130 0     Pain Loc --      Pain Education --      Exclude from Growth Chart --    No data found.  Updated Vital Signs BP (!) 140/85   Pulse 99   Temp 99.4 F (37.4 C)   Resp 16   LMP 05/20/2023 (Exact Date)  SpO2 97%      Physical Exam Constitutional:      General: She is not in acute distress.    Appearance: She is well-developed. She is ill-appearing.  HENT:     Head: Normocephalic and atraumatic.     Right Ear: Tympanic membrane normal.     Left Ear: Tympanic membrane normal.     Nose: Nose normal.     Mouth/Throat:     Pharynx: No posterior oropharyngeal erythema.  Eyes:     Conjunctiva/sclera: Conjunctivae normal.     Pupils: Pupils are equal, round, and reactive to light.  Cardiovascular:     Rate and Rhythm: Normal rate and regular rhythm.     Heart sounds: Normal heart sounds.  Pulmonary:     Effort: Pulmonary effort is normal. No respiratory distress.  Abdominal:     General: There is no distension.     Palpations: Abdomen is soft.  Musculoskeletal:        General: Normal range of motion.     Cervical back: Normal range of motion.  Skin:    General: Skin is warm and dry.  Neurological:     Mental Status: She is alert.      UC Treatments / Results  Labs (all labs ordered are listed, but only abnormal results are displayed) Labs Reviewed  POCT INFLUENZA A/B  POC SARS CORONAVIRUS 2 AG -  ED    EKG   Radiology No results  found.  Procedures Procedures (including critical care time)  Medications Ordered in UC Medications - No data to display  Initial Impression / Assessment and Plan / UC Course  I have reviewed the triage vital signs and the nursing notes.  Pertinent labs & imaging results that were available during my care of the patient were reviewed by me and considered in my medical decision making (see chart for details).     COVID and flu testing are negative This may be a virus but because of her severity of symptoms, azithromycin  is being prescribed Final Clinical Impressions(s) / UC Diagnoses   Final diagnoses:  Acute upper respiratory infection  Acute bronchitis, unspecified organism     Discharge Instructions      Drink lots of fluids Take the azithromycin  antibiotic as directed Take the Tessalon  2-3 times a day to help reduce cough May take Mucinex DM in addition if needed See your doctor if not improving by next week COVID and flu testing are negative     ED Prescriptions     Medication Sig Dispense Auth. Provider   azithromycin  (ZITHROMAX  Z-PAK) 250 MG tablet Take two pills today followed by one a day until gone 6 tablet Maranda Jamee Jacob, MD   benzonatate  (TESSALON ) 200 MG capsule Take 1 capsule (200 mg total) by mouth 3 (three) times daily as needed for cough. 21 capsule Maranda Jamee Jacob, MD      PDMP not reviewed this encounter.   Maranda Jamee Jacob, MD 08/18/23 [phone]

## 2023-08-18 NOTE — ED Triage Notes (Addendum)
 Sick since Saturday morning (was flying), had head congestion, felt hot like had a fever. Cough started Sunday. Still has cough, productive of green sputum, chest feels tight, ears feel full. Has had tylenol cold and flu.
